# Patient Record
Sex: Male | Born: 2000 | Hispanic: No | Marital: Single | Smoking: Never smoker
Health system: Southern US, Community
[De-identification: ages and names within clinical notes are randomized; demographics above are authoritative.]

---

## 2018-03-01 ENCOUNTER — Ambulatory Visit: Payer: Self-pay | Admitting: Family Medicine

## 2018-03-01 ENCOUNTER — Encounter: Payer: Self-pay | Admitting: Family Medicine

## 2018-03-01 VITALS — BP 110/84 | HR 67 | Temp 99.1°F | Wt 165.8 lb

## 2018-03-01 DIAGNOSIS — J029 Acute pharyngitis, unspecified: Secondary | ICD-10-CM

## 2018-03-01 DIAGNOSIS — B349 Viral infection, unspecified: Secondary | ICD-10-CM

## 2018-03-01 LAB — POCT RAPID STREP A (OFFICE): Rapid Strep A Screen: NEGATIVE

## 2018-03-01 NOTE — Patient Instructions (Signed)
PLAN< Tylenol 650 mg or Motrin 600 mg (with food) for the next 2 days Warm Salt water gargles Change toothbrush Over the counter throat lozenge Increase hydration, balanced diet  Viral Illness, Adult Viruses are tiny germs that can get into a person's body and cause illness. There are many different types of viruses, and they cause many types of illness. Viral illnesses can range from mild to severe. They can affect various parts of the body. Common illnesses that are caused by a virus include colds and the flu. Viral illnesses also include serious conditions such as HIV/AIDS (human immunodeficiency virus/acquired immunodeficiency syndrome). A few viruses have been linked to certain cancers. What are the causes? Many types of viruses can cause illness. Viruses invade cells in your body, multiply, and cause the infected cells to malfunction or die. When the cell dies, it releases more of the virus. When this happens, you develop symptoms of the illness, and the virus continues to spread to other cells. If the virus takes over the function of the cell, it can cause the cell to divide and grow out of control, as is the case when a virus causes cancer. Different viruses get into the body in different ways. You can get a virus by:  Swallowing food or water that is contaminated with the virus.  Breathing in droplets that have been coughed or sneezed into the air by an infected person.  Touching a surface that has been contaminated with the virus and then touching your eyes, nose, or mouth.  Being bitten by an insect or animal that carries the virus.  Having sexual contact with a person who is infected with the virus.  Being exposed to blood or fluids that contain the virus, either through an open cut or during a transfusion.  If a virus enters your body, your body's defense system (immune system) will try to fight the virus. You may be at higher risk for a viral illness if your immune system is  weak. What are the signs or symptoms? Symptoms vary depending on the type of virus and the location of the cells that it invades. Common symptoms of the main types of viral illnesses include: Cold and flu viruses  Fever.  Headache.  Sore throat.  Muscle aches.  Nasal congestion.  Cough. Digestive system (gastrointestinal) viruses  Fever.  Abdominal pain.  Nausea.  Diarrhea. Liver viruses (hepatitis)  Loss of appetite.  Tiredness.  Yellowing of the skin (jaundice). Brain and spinal cord viruses  Fever.  Headache.  Stiff neck.  Nausea and vomiting.  Confusion or sleepiness. Skin viruses  Warts.  Itching.  Rash. Sexually transmitted viruses  Discharge.  Swelling.  Redness.  Rash. How is this treated? Viruses can be difficult to treat because they live within cells. Antibiotic medicines do not treat viruses because these drugs do not get inside cells. Treatment for a viral illness may include:  Resting and drinking plenty of fluids.  Medicines to relieve symptoms. These can include over-the-counter medicine for pain and fever, medicines for cough or congestion, and medicines to relieve diarrhea.  Antiviral medicines. These drugs are available only for certain types of viruses. They may help reduce flu symptoms if taken early. There are also many antiviral medicines for hepatitis and HIV/AIDS.  Some viral illnesses can be prevented with vaccinations. A common example is the flu shot. Follow these instructions at home: Medicines   Take over-the-counter and prescription medicines only as told by your health care provider.  If  you were prescribed an antiviral medicine, take it as told by your health care provider. Do not stop taking the medicine even if you start to feel better.  Be aware of when antibiotics are needed and when they are not needed. Antibiotics do not treat viruses. If your health care provider thinks that you may have a bacterial  infection as well as a viral infection, you may get an antibiotic. ? Do not ask for an antibiotic prescription if you have been diagnosed with a viral illness. That will not make your illness go away faster. ? Frequently taking antibiotics when they are not needed can lead to antibiotic resistance. When this develops, the medicine no longer works against the bacteria that it normally fights. General instructions  Drink enough fluids to keep your urine clear or pale yellow.  Rest as much as possible.  Return to your normal activities as told by your health care provider. Ask your health care provider what activities are safe for you.  Keep all follow-up visits as told by your health care provider. This is important. How is this prevented? Take these actions to reduce your risk of viral infection:  Eat a healthy diet and get enough rest.  Wash your hands often with soap and water. This is especially important when you are in public places. If soap and water are not available, use hand sanitizer.  Avoid close contact with friends and family who have a viral illness.  If you travel to areas where viral gastrointestinal infection is common, avoid drinking water or eating raw food.  Keep your immunizations up to date. Get a flu shot every year as told by your health care provider.  Do not share toothbrushes, nail clippers, razors, or needles with other people.  Always practice safe sex.  Contact a health care provider if:  You have symptoms of a viral illness that do not go away.  Your symptoms come back after going away.  Your symptoms get worse. Get help right away if:  You have trouble breathing.  You have a severe headache or a stiff neck.  You have severe vomiting or abdominal pain. This information is not intended to replace advice given to you by your health care provider. Make sure you discuss any questions you have with your health care provider. Document Released:  09/12/2015 Document Revised: 10/15/2015 Document Reviewed: 09/12/2015 Elsevier Interactive Patient Education  [employer].

## 2018-03-01 NOTE — Progress Notes (Signed)
Scott Huang is a 17 y.o. male who presents today with concerns of sinus congestion and sore throat for the last 3 days. He reports recent travel and exposure to drastically differing temperatures over 2 days.  Review of Systems  Constitutional: Negative for chills, fever and malaise/fatigue.  HENT: Positive for congestion and sore throat. Negative for ear discharge, ear pain and sinus pain.   Eyes: Negative.   Respiratory: Negative for cough, sputum production and shortness of breath.   Cardiovascular: Negative.  Negative for chest pain.  Gastrointestinal: Negative for abdominal pain, diarrhea, nausea and vomiting.  Genitourinary: Negative for dysuria, frequency, hematuria and urgency.  Musculoskeletal: Negative for myalgias.  Skin: Negative.   Neurological: Negative for headaches.  Endo/Heme/Allergies: Negative.   Psychiatric/Behavioral: Negative.     O: Vitals:   03/01/18 1650  BP: 110/84  Pulse: 67  Temp: 99.1 F (37.3 C)  SpO2: 98%     Physical Exam  Constitutional: He is oriented to person, place, and time. Vital signs are normal. He appears well-developed and well-nourished. He is active.  Non-toxic appearance. He does not have a sickly appearance.  HENT:  Head: Normocephalic.  Right Ear: Hearing, tympanic membrane, external ear and ear canal normal.  Left Ear: Hearing, tympanic membrane, external ear and ear canal normal.  Nose: Nose normal.  Mouth/Throat: Uvula is midline. Oropharyngeal exudate and posterior oropharyngeal erythema present. Tonsils are 1+ on the right. Tonsils are 1+ on the left. No tonsillar exudate.  Neck: Normal range of motion. Neck supple.  Cardiovascular: Normal rate, regular rhythm, normal heart sounds and normal pulses.  Pulmonary/Chest: Effort normal and breath sounds normal.  Abdominal: Soft. Bowel sounds are normal.  Musculoskeletal: Normal range of motion.  Lymphadenopathy:       Head (right side): No submental and no submandibular  adenopathy present.       Head (left side): No submental and no submandibular adenopathy present.    He has no cervical adenopathy.  Neurological: He is alert and oriented to person, place, and time.  Psychiatric: He has a normal mood and affect.  Vitals reviewed.    A: 1. Sore throat   2. Viral illness      P: Exam findings, diagnosis etiology and medication use and indications reviewed with patient. Follow- Up and discharge instructions provided. No emergent/urgent issues found on exam.  Patient verbalized understanding of information provided and agrees with plan of care (POC), all questions answered.  1. Sore throat - POCT rapid strep A Results for orders placed or performed in visit on 03/01/18 (from the past 24 hour(s))  POCT rapid strep A     Status: None   Collection Time: 03/01/18  5:00 PM  Result Value Ref Range   Rapid Strep A Screen Negative Negative   2. Viral illness Supportive symptomatic care- exam unremarkable Work note x 48 hours a patient speaks for his job primarily.

## 2019-07-30 ENCOUNTER — Emergency Department (HOSPITAL_COMMUNITY): Payer: BC Managed Care – PPO

## 2019-07-30 ENCOUNTER — Other Ambulatory Visit: Payer: Self-pay

## 2019-07-30 ENCOUNTER — Encounter (HOSPITAL_COMMUNITY): Payer: Self-pay

## 2019-07-30 ENCOUNTER — Emergency Department (HOSPITAL_COMMUNITY)
Admission: EM | Admit: 2019-07-30 | Discharge: 2019-07-30 | Disposition: A | Discharge status: Home / Self Care | Payer: BC Managed Care – PPO | Attending: Emergency Medicine | Admitting: Emergency Medicine

## 2019-07-30 DIAGNOSIS — Y9229 Other specified public building as the place of occurrence of the external cause: Secondary | ICD-10-CM | POA: Diagnosis not present

## 2019-07-30 DIAGNOSIS — R112 Nausea with vomiting, unspecified: Secondary | ICD-10-CM | POA: Diagnosis not present

## 2019-07-30 DIAGNOSIS — Y998 Other external cause status: Secondary | ICD-10-CM | POA: Diagnosis not present

## 2019-07-30 DIAGNOSIS — S060X9A Concussion with loss of consciousness of unspecified duration, initial encounter: Secondary | ICD-10-CM | POA: Diagnosis not present

## 2019-07-30 DIAGNOSIS — W0110XA Fall on same level from slipping, tripping and stumbling with subsequent striking against unspecified object, initial encounter: Secondary | ICD-10-CM | POA: Diagnosis not present

## 2019-07-30 DIAGNOSIS — S0990XA Unspecified injury of head, initial encounter: Secondary | ICD-10-CM | POA: Diagnosis present

## 2019-07-30 DIAGNOSIS — Y9389 Activity, other specified: Secondary | ICD-10-CM | POA: Diagnosis not present

## 2019-07-30 NOTE — ED Triage Notes (Signed)
Patient arrived stating on Friday night he fell and hit head on the right side, noticed swelling after injury. Since he has had constant nausea and one episode of vomiting. Denies any LOC, no blood thinners. Has been taking Ibuprofen with little relief.

## 2019-07-30 NOTE — ED Provider Notes (Signed)
[facility] DEPT Provider Note   CSN: [id number] Arrival date & time: 07/30/19  0058     History Chief Complaint  Patient presents with  . Headache    Scott Huang is a 19 y.o. male.   Head Injury Location:  R parietal Time since incident:  2 days Mechanism of injury: fall   Pain details:    Quality:  Aching   Severity:  Moderate   Timing:  Constant   Progression:  Worsening Chronicity:  New Relieved by:  Nothing Worsened by:  Nothing Associated symptoms: headache, nausea and vomiting   Associated symptoms: no double vision, no focal weakness and no neck pain   Patient without any medical conditions presents after head injury 2 nights ago.  Patient reports he was at a cabin in [place] with friends and was drinking alcohol.  He reports he slipped and hit his head on the right side.  He is unsure if he had LOC but definitively had vomiting soon after.  Since then he has had persistent nausea and headaches that are not responding to ibuprofen.  No focal weakness.      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 Smoking status: Never Smoker  . Smokeless tobacco: Never Used  Substance Use Topics  . Alcohol use: Not on file  . Drug use: Not on file    Home Medications Prior to Admission medications   Medication Sig Start Date End Date Taking? Authorizing Provider  doxycycline (VIBRA-TABS) 100 MG tablet Take 100 mg by mouth daily.    Yes [provider]  ibuprofen (ADVIL) 200 MG tablet Take 400 mg by mouth every 6 (six) hours as needed for headache, mild pain, moderate pain or cramping.   Yes [provider]  loratadine (CLARITIN) 10 MG tablet Take 10 mg by mouth daily as needed for allergies.    Yes [provider]    Allergies    Patient has no known allergies.  Review of Systems   Review of Systems  Constitutional: Negative for fever.  Eyes: Negative for double  vision.  Gastrointestinal: Positive for nausea and vomiting.  Musculoskeletal: Negative for neck pain.  Neurological: Positive for headaches. Negative for focal weakness and weakness.  All other systems reviewed and are negative.   Physical Exam Updated Vital Signs BP (!) 140/91 (BP Location: Right Arm)   Pulse 63   Temp 98.5 F (36.9 C) (Oral)   Resp 14   Ht 1.778 m (5\' 10" )   Wt 77.1 kg   SpO2 100%   BMI 24.39 kg/m   Physical Exam CONSTITUTIONAL: Well developed/well nourished HEAD: tendeness to right side of scalp, no crepitus or stepoffs EYES: EOMI/PERRL ENMT: Mucous membranes moist NECK: supple no meningeal signs SPINE/BACK:entire spine nontender, No bruising/crepitance/stepoffs noted to spine CV: S1/S2 noted, no murmurs/rubs/gallops noted LUNGS: Lungs are clear to auscultation bilaterally, no apparent distress ABDOMEN: soft, nontender NEURO: Pt is awake/alert/appropriate, moves all extremitiesx4.  No facial droop.  GCS 15 EXTREMITIES: pulses normal/equal, full ROM SKIN: warm, color normal PSYCH: no abnormalities of mood noted, alert and oriented to situation  ED Results / Procedures / Treatments   Labs (all labs ordered are listed, but only abnormal results are displayed) Labs Reviewed - No data to display  EKG None  Radiology CT Head Wo Contrast  Result Date: 07/30/2019 CLINICAL DATA:  Fall Friday night, swelling since injury with continued nausea and emesis EXAM: CT  HEAD WITHOUT CONTRAST TECHNIQUE: Contiguous axial images were obtained from the base of the skull through the vertex without intravenous contrast. COMPARISON:  None. FINDINGS: Brain: No evidence of acute infarction, hemorrhage, hydrocephalus, extra-axial collection or mass lesion/mass effect. Vascular: No hyperdense vessel or unexpected calcification. Skull: Small fluid attenuation cystic structure in the right occipital scalp likely reflects a small dermal inclusion cyst/tricholemmal cyst. No  significant scalp swelling, hema0toma, calvarial fracture or other acute or suspicious osseous abnormality. Sinuses/Orbits: Paranasal sinuses and mastoid air cells are predominantly clear. Other: None IMPRESSION: No acute intracranial findings. Electronically Signed   By: Kreg Shropshire M.D.   On: 07/30/2019 02:49    Procedures Procedures    Medications Ordered in ED Medications - No data to display  ED Course  I have reviewed the triage vital signs and the nursing notes.  Pertinent  imaging results that were available during my care of the patient were reviewed by me and considered in my medical decision making (see chart for details).    MDM Rules/Calculators/A&P                      2:51 AM Discussed with mother, she is requesting CT head 3:23 AM CT head negative.  Likely concussion.  Discharged home Final Clinical Impression(s) / ED Diagnoses Final diagnoses:  Concussion with loss of consciousness, initial encounter    Rx / DC Orders ED Discharge Orders    None       Zadie Rhine, MD 07/30/19 [phone]

## 2020-02-06 ENCOUNTER — Other Ambulatory Visit: Payer: BC Managed Care – PPO

## 2020-02-06 DIAGNOSIS — Z20822 Contact with and (suspected) exposure to covid-19: Secondary | ICD-10-CM

## 2020-02-08 LAB — NOVEL CORONAVIRUS, NAA: SARS-CoV-2, NAA: NOT DETECTED

## 2020-02-08 LAB — SARS-COV-2, NAA 2 DAY TAT

## 2020-03-17 ENCOUNTER — Encounter (HOSPITAL_COMMUNITY): Payer: Self-pay

## 2020-03-17 ENCOUNTER — Emergency Department (HOSPITAL_COMMUNITY)
Admission: EM | Admit: 2020-03-17 | Discharge: 2020-03-17 | Disposition: A | Discharge status: Home / Self Care | Payer: BC Managed Care – PPO | Attending: Emergency Medicine | Admitting: Emergency Medicine

## 2020-03-17 ENCOUNTER — Other Ambulatory Visit: Payer: Self-pay

## 2020-03-17 DIAGNOSIS — R070 Pain in throat: Secondary | ICD-10-CM | POA: Diagnosis present

## 2020-03-17 DIAGNOSIS — J020 Streptococcal pharyngitis: Secondary | ICD-10-CM | POA: Insufficient documentation

## 2020-03-17 DIAGNOSIS — R110 Nausea: Secondary | ICD-10-CM | POA: Insufficient documentation

## 2020-03-17 LAB — GROUP A STREP BY PCR: Group A Strep by PCR: DETECTED — AB

## 2020-03-17 MED ORDER — PENICILLIN G BENZATHINE 1200000 UNIT/2ML IM SUSP
1.2000 10*6.[IU] | Freq: Once | INTRAMUSCULAR | Status: AC
  Administered 2020-03-17: 1.2 10*6.[IU] via INTRAMUSCULAR
  Filled 2020-03-17: qty 2

## 2020-03-17 NOTE — ED Triage Notes (Signed)
Patient arrived stating that yesterday he began having a sore throat, nausea, body aches.

## 2020-03-17 NOTE — ED Provider Notes (Signed)
[facility]-EMERGENCY DEPT Provider Note   CSN: [id number] Arrival date & time: 03/17/20  2100     History Chief Complaint  Patient presents with  . Sore Throat    Scott Huang is a 19 y.o. male without significant past medical history who presents to the emergency department with complaints of sore throat that began yesterday.  Patient states her throat is constant, no significant alleviating aggravating factors, and associated with chills, body ache, and nausea.  Had evaluation by outpatient provider who prescribed methylprednisone and Augmentin.  Denies fever, ear pain, cough, dyspnea, abdominal pain, or syncope.  HPI     History reviewed. No pertinent past medical history.  There are no problems to display for this patient.   History reviewed. No pertinent surgical history.     History reviewed. No pertinent family history.  Social History   Tobacco Use  . Smoking status: Never Smoker  . Smokeless tobacco: Never Used  Substance Use Topics  . Alcohol use: Not on file  . Drug use: Not on file    Home Medications Prior to Admission medications   Medication Sig Start Date End Date Taking? Authorizing Provider  amoxicillin-clavulanate (AUGMENTIN) 875-125 MG tablet Take 1 tablet by mouth 2 (two) times daily. 03/17/20  Yes [provider]  ibuprofen (ADVIL) 200 MG tablet Take 400 mg by mouth every 6 (six) hours as needed for headache, mild pain, moderate pain or cramping.   Yes [provider]  methylPREDNISolone (MEDROL DOSEPAK) 4 MG TBPK tablet Take 4 mg by mouth See admin instructions. 6 day taper 03/17/20  Yes [provider]    Allergies    Patient has no known allergies.  Review of Systems   Review of Systems  Constitutional: Positive for chills. Negative for fever.  HENT: Positive for sore throat. Negative for ear pain.   Respiratory: Negative for cough and shortness of breath.   Cardiovascular: Negative  for chest pain.  Gastrointestinal: Positive for nausea. Negative for abdominal pain.  Genitourinary: Negative for dysuria.  Musculoskeletal: Positive for myalgias.  Neurological: Negative for syncope.    Physical Exam Updated Vital Signs BP (!) 151/86 (BP Location: Right Arm)   Pulse 96   Temp 98.4 F (36.9 C) (Oral)   Resp 20   Ht 5\' 10"  (1.778 m)   Wt 74.8 kg   SpO2 96%   BMI 23.68 kg/m   Physical Exam Vitals and nursing note reviewed.  Constitutional:      General: He is not in acute distress.    Appearance: He is well-developed. He is not toxic-appearing.  HENT:     Head: Normocephalic and atraumatic.     Right Ear: Ear canal normal. Tympanic membrane is not perforated, erythematous, retracted or bulging.     Left Ear: Ear canal normal. Tympanic membrane is not perforated, erythematous, retracted or bulging.     Ears:     Comments: No mastoid erythema/swellng/tenderness.     Nose:     Right Sinus: No maxillary sinus tenderness or frontal sinus tenderness.     Left Sinus: No maxillary sinus tenderness or frontal sinus tenderness.     Mouth/Throat:     Pharynx: Oropharynx is clear. Uvula midline. Posterior oropharyngeal erythema present. No oropharyngeal exudate.     Comments: Posterior oropharynx is symmetric appearing. Patient tolerating own secretions without difficulty. No trismus. No drooling. No hot potato voice. No swelling beneath the tongue, submandibular compartment is soft.  Eyes:     General:  Right eye: No discharge.        Left eye: No discharge.     Conjunctiva/sclera: Conjunctivae normal.  Cardiovascular:     Rate and Rhythm: Normal rate and regular rhythm.  Pulmonary:     Effort: Pulmonary effort is normal. No respiratory distress.     Breath sounds: Normal breath sounds. No wheezing, rhonchi or rales.  Abdominal:     General: There is no distension.     Palpations: Abdomen is soft.     Tenderness: There is no abdominal tenderness.   Musculoskeletal:     Cervical back: Neck supple. No rigidity.  Lymphadenopathy:     Cervical: No cervical adenopathy.  Skin:    General: Skin is warm and dry.     Findings: No rash.  Neurological:     Mental Status: He is alert.  Psychiatric:        Behavior: Behavior normal.     ED Results / Procedures / Treatments   Labs (all labs ordered are listed, but only abnormal results are displayed) Labs Reviewed  GROUP A STREP BY PCR - Abnormal; Notable for the following components:      Result Value   Group A Strep by PCR DETECTED (*)    All other components within normal limits    EKG None  Radiology No results found.  Procedures Procedures (including critical care time)  Medications Ordered in ED Medications  penicillin g benzathine (BICILLIN LA) 1200000 UNIT/2ML injection 1.2 Million Units (has no administration in time range)    ED Course  I have reviewed the triage vital signs and the nursing notes.  Pertinent labs & imaging results that were available during my care of the patient were reviewed by me and considered in my medical decision making (see chart for details).    MDM Rules/Calculators/A&P                          Patient presents to the emergency department with complaints of sore throat.  He is nontoxic, resting comfortably, his vitals are within normal limits with the exception of his elevated blood pressure, doubt hypertensive emergency.  Strep test is positive, suspect this is the underlying etiology of patient's symptoms-he was given Augmentin twice daily for 14 days as well as a steroid from an alternative provider, we discussed options of treatment, ultimately he would like to receive IM Bicillin in the emergency department for treatment of this, will not take the Augmentin he was previously prescribed.  His exam is not consistent with RPA/PTA.  He has no meningismus.  His lungs are clear to auscultation without signs of respiratory distress.  His  abdomen is nontender without peritoneal signs.  He appears appropriate for discharge home at this time. I discussed results, treatment plan, need for follow-up, and return precautions with the patient. Provided opportunity for questions, patient confirmed understanding and is in agreement with plan.   Final Clinical Impression(s) / ED Diagnoses Final diagnoses:  Strep pharyngitis    Rx / DC Orders ED Discharge Orders    None       Cherly Anderson, PA-C 03/17/20 2319    Charlynne Pander, MD 03/17/20 [phone]

## 2020-03-17 NOTE — ED Notes (Signed)
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 Requesting to not get swabbed at this time. Reports concerned for Mono

## 2020-03-17 NOTE — Discharge Instructions (Addendum)
You were seen in the emergency department today for a sore throat.  Your strep test was positive.  We treated this with a shot of antibiotics in the emergency department (Bicillin)-given we have given you the shot here in the emergency department you do not need to take the Augmentin that was previously prescribed to you.  You may take the methylprednisolone that was prescribed to you-this is a steroid to help with pain and inflammation.  You may also take Tylenol per over-the-counter dosing as needed for discomfort as well.  Please follow-up with a primary care provider within 5 days-you may call the number circled in your discharge instructions for assistance finding a primary care provider in the area.  Return to the ER for new or worsening symptoms or any other concerns.  Your blood pressure was also elevated in the ER today, have this rechecked your primary care appointment.  Vitals:   03/17/20 2107  BP: (!) 151/86  Pulse: 96  Resp: 20  Temp: 98.4 F (36.9 C)  SpO2: 96%

## 2020-05-07 ENCOUNTER — Ambulatory Visit
Admission: EM | Admit: 2020-05-07 | Discharge: 2020-05-07 | Disposition: A | Discharge status: Home / Self Care | Payer: BC Managed Care – PPO

## 2020-05-07 ENCOUNTER — Other Ambulatory Visit: Payer: Self-pay

## 2020-05-07 DIAGNOSIS — K602 Anal fissure, unspecified: Secondary | ICD-10-CM

## 2020-05-07 NOTE — ED Triage Notes (Signed)
Pt c/o a pain/sensation to rectum a week ago. States took a pic and saw a tear. States week prior to this had constipation and was straining. States treating with wipes and hemorrhoids creams. Now has a rash to rectum area and having itching. States has pain when having a BM.

## 2020-05-07 NOTE — Discharge Instructions (Signed)
Sitz bath as needed for additional pain relief: For few inches of warm, plain water and sit. Avoid Epson salt as this can burn. Use rectal suppositories as directed. Very important follow-up with surgery for further evaluation and management. Go to ER for severe pain, bleeding, fever. 

## 2020-05-07 NOTE — ED Provider Notes (Signed)
[facility]    CSN: [id number] Arrival date & time: 05/07/20  1913      History   Chief Complaint Chief Complaint  Patient presents with  . Rectal Pain    HPI Scott Huang is a 19 y.o. male  Presenting for anal pain x1 week.  Denies trauma to affected area.  Was able to "look and saw a little paper cut looking thing".  Did have constipation prior to onset, though is now having 1 bowel movement daily.  Has been using wipes and hemorrhoid creams without relief.  History reviewed. No pertinent past medical history.  There are no problems to display for this patient.   History reviewed. No pertinent surgical history.     Home Medications    Prior to Admission medications   Medication Sig Start Date End Date Taking? Authorizing Provider  doxycycline (ADOXA) 50 MG tablet Take 50 mg by mouth daily.    [provider]  loratadine (CLARITIN) 10 MG tablet Take 10 mg by mouth daily.    [provider]  omeprazole (PRILOSEC) 20 MG capsule Take 20 mg by mouth daily.    [provider]    Family History History reviewed. No pertinent family history.  Social History Social History   Tobacco Use  . Smoking status: Never Smoker  . Smokeless tobacco: Never Used  Substance Use Topics  . Alcohol use: Yes  . Drug use: Not Currently     Allergies   Patient has no known allergies.   Review of Systems Review of Systems  Constitutional: Negative for fatigue and fever.  Respiratory: Negative for cough and shortness of breath.   Cardiovascular: Negative for chest pain and palpitations.  Gastrointestinal: Positive for rectal pain. Negative for abdominal pain, diarrhea, nausea and vomiting.  Musculoskeletal: Negative for arthralgias and myalgias.  Skin: Negative for rash and wound.  Neurological: Negative for speech difficulty and headaches.  All other systems reviewed and are negative.    Physical Exam Triage Vital Signs ED  Triage Vitals  Enc Vitals Group     BP 05/07/20 1926 137/73     Pulse Rate 05/07/20 1926 89     Resp 05/07/20 1926 20     Temp 05/07/20 1926 98.3 F (36.8 C)     Temp Source 05/07/20 1926 Oral     SpO2 05/07/20 1926 98 %     Weight --      Height --      Head Circumference --      Peak Flow --      Pain Score 05/07/20 1931 0     Pain Loc --      Pain Edu? --      Excl. in GC? --    No data found.  Updated Vital Signs BP 137/73 (BP Location: Left Arm)   Pulse 89   Temp 98.3 F (36.8 C) (Oral)   Resp 20   SpO2 98%   Visual Acuity Right Eye Distance:   Left Eye Distance:   Bilateral Distance:    Right Eye Near:   Left Eye Near:    Bilateral Near:     Physical Exam Constitutional:      General: He is not in acute distress. HENT:     Head: Normocephalic and atraumatic.  Eyes:     General: No scleral icterus.    Pupils: Pupils are equal, round, and reactive to light.  Cardiovascular:     Rate and Rhythm: Normal rate.  Pulmonary:     Effort: Pulmonary effort is normal. No respiratory distress.     Breath sounds: No wheezing.  Genitourinary:    Comments: Small fissure, left lateral Skin:    Coloration: Skin is not jaundiced or pale.  Neurological:     Mental Status: He is alert and oriented to person, place, and time.      UC Treatments / Results  Labs (all labs ordered are listed, but only abnormal results are displayed) Labs Reviewed - No data to display  EKG   Radiology No results found.  Procedures Procedures (including critical care time)  Medications Ordered in UC Medications - No data to display  Initial Impression / Assessment and Plan / UC Course  I have reviewed the triage vital signs and the nursing notes.  Pertinent labs & imaging results that were available during my care of the patient were reviewed by me and considered in my medical decision making (see chart for details).     We will treat supportively as below, provided GI  follow-up information as needed.  Return precautions discussed, pt verbalized understanding and is agreeable to plan. Final Clinical Impressions(s) / UC Diagnoses   Final diagnoses:  Anal fissure     Discharge Instructions     Sitz bath as needed for additional pain relief: For few inches of warm, plain water and sit. Avoid Epson salt as this can burn. Use rectal suppositories as directed. Very important follow-up with surgery for further evaluation and management. Go to ER for severe pain, bleeding, fever.    ED Prescriptions    None     PDMP not reviewed this encounter.   Odette Fraction [place], [place] 05/08/20 [phone]

## 2020-08-24 ENCOUNTER — Ambulatory Visit (INDEPENDENT_AMBULATORY_CARE_PROVIDER_SITE_OTHER): Payer: BC Managed Care – PPO

## 2020-08-24 ENCOUNTER — Ambulatory Visit (HOSPITAL_COMMUNITY)
Admission: EM | Admit: 2020-08-24 | Discharge: 2020-08-24 | Disposition: A | Discharge status: Home / Self Care | Payer: BC Managed Care – PPO | Attending: Physician Assistant | Admitting: Physician Assistant

## 2020-08-24 ENCOUNTER — Other Ambulatory Visit: Payer: Self-pay

## 2020-08-24 ENCOUNTER — Encounter (HOSPITAL_COMMUNITY): Payer: Self-pay | Admitting: Emergency Medicine

## 2020-08-24 DIAGNOSIS — X58XXXA Exposure to other specified factors, initial encounter: Secondary | ICD-10-CM | POA: Diagnosis not present

## 2020-08-24 DIAGNOSIS — S99911A Unspecified injury of right ankle, initial encounter: Secondary | ICD-10-CM | POA: Diagnosis not present

## 2020-08-24 DIAGNOSIS — M25571 Pain in right ankle and joints of right foot: Secondary | ICD-10-CM | POA: Diagnosis not present

## 2020-08-24 DIAGNOSIS — M25471 Effusion, right ankle: Secondary | ICD-10-CM

## 2020-08-24 MED ORDER — NAPROXEN 500 MG PO TABS: 500.0000 mg | ORAL_TABLET | Freq: Two times a day (BID) | ORAL | 0 refills | Status: AC

## 2020-08-24 NOTE — ED Triage Notes (Signed)
Pt presents with right ankle swelling and pain. States rolled ankle last night.

## 2020-08-24 NOTE — ED Provider Notes (Signed)
[facility]    CSN: [id number] Arrival date & time: 08/24/20  1624      History   Chief Complaint Chief Complaint  Patient presents with  . Ankle Pain    Right    HPI Scott Huang is a 20 y.o. male.   Patient presents today with a 1 day history of right ankle pain after injury.  Reports that he was camping and tripped and twisted his ankle.  He was able to ambulate immediately following injury but pain has gradually been worsening.  At rest pain is rated 5 on a 0-10 pain scale but increases to 8 with ambulation/bearing weight, localized to lateral right ankle without radiation, described as aching periodic sharp pains, no aggravating or alleviating factors identified.  He denies previous injury or surgery to the ankle.  He denies any numbness or tingling in toes.  He has not tried any over-the-counter medications for symptom management.  He is requesting excuse note today as his job requires him to walk for 2 hours or more reasonably to be capable of this given injury.  No additional complaints or concerns.     History reviewed. No pertinent past medical history.  There are no problems to display for this patient.   History reviewed. No pertinent surgical history.     Home Medications    Prior to Admission medications   Medication Sig Start Date End Date Taking? Authorizing Provider  naproxen (NAPROSYN) 500 MG tablet Take 1 tablet (500 mg total) by mouth 2 (two) times daily. 08/24/20  Yes Edda Orea K, PA-C  doxycycline (ADOXA) 50 MG tablet Take 50 mg by mouth daily.    [provider]  loratadine (CLARITIN) 10 MG tablet Take 10 mg by mouth daily.    [provider]  omeprazole (PRILOSEC) 20 MG capsule Take 20 mg by mouth daily.    [provider]    Family History History reviewed. No pertinent family history.  Social History Social History   Tobacco Use  . Smoking status: Never Smoker  . Smokeless tobacco: Never Used   Substance Use Topics  . Alcohol use: Yes  . Drug use: Not Currently     Allergies   Patient has no known allergies.   Review of Systems Review of Systems  Constitutional: Positive for activity change. Negative for appetite change, fatigue and fever.  Respiratory: Negative for cough and shortness of breath.   Cardiovascular: Negative for chest pain.  Gastrointestinal: Negative for abdominal pain, diarrhea, nausea and vomiting.  Musculoskeletal: Positive for arthralgias, gait problem and joint swelling. Negative for myalgias.  Neurological: Negative for dizziness, light-headedness, numbness and headaches.     Physical Exam Triage Vital Signs ED Triage Vitals  Enc Vitals Group     BP 08/24/20 1704 124/76     Pulse Rate 08/24/20 1704 68     Resp 08/24/20 1704 16     Temp 08/24/20 1704 97.9 F (36.6 C)     Temp Source 08/24/20 1704 Oral     SpO2 08/24/20 1704 100 %     Weight --      Height --      Head Circumference --      Peak Flow --      Pain Score 08/24/20 1701 5     Pain Loc --      Pain Edu? --      Excl. in GC? --    No data found.  Updated Vital Signs BP 124/76 (  BP Location: Right Arm)   Pulse 68   Temp 97.9 F (36.6 C) (Oral)   Resp 16   SpO2 100%   Visual Acuity Right Eye Distance:   Left Eye Distance:   Bilateral Distance:    Right Eye Near:   Left Eye Near:    Bilateral Near:     Physical Exam Vitals reviewed.  Constitutional:      General: He is awake.     Appearance: Normal appearance. He is normal weight. He is not ill-appearing.     Comments: Very pleasant male appears stated age in no acute distress  HENT:     Head: Normocephalic and atraumatic.  Cardiovascular:     Rate and Rhythm: Normal rate and regular rhythm.     Heart sounds: No murmur heard.     Comments: Capillary refill within 2 seconds bilateral toes Pulmonary:     Effort: Pulmonary effort is normal.     Breath sounds: Normal breath sounds. No stridor. No wheezing,  rhonchi or rales.     Comments: Clear to auscultation bilaterally Musculoskeletal:     Right ankle: Swelling present. No ecchymosis. Tenderness present over the lateral malleolus. Decreased range of motion. Normal pulse.     Comments: Right ankle: Swelling noted lateral right ankle.  No deformity noted.  Decreased range of motion with dorsiflexion and plantar flexion secondary to pain.  Foot neurovascularly intact.  Pain at lateral malleolus.  Neurological:     Mental Status: He is alert.  Psychiatric:        Behavior: Behavior is cooperative.      UC Treatments / Results  Labs (all labs ordered are listed, but only abnormal results are displayed) Labs Reviewed - No data to display  EKG   Radiology DG Ankle Complete Right  Result Date: 08/24/2020 CLINICAL DATA:  Swelling after injury. EXAM: RIGHT ANKLE - COMPLETE 3+ VIEW COMPARISON:  None. FINDINGS: No evidence for an acute fracture. No subluxation or dislocation. Tiny well corticated fragment adjacent to the lateral malleolus likely a prior avulsion injury. No worrisome lytic or sclerotic osseous abnormality. IMPRESSION: Negative. Electronically Signed   By: Kennith Center M.D.   On: 08/24/2020 18:00    Procedures Procedures (including critical care time)  Medications Ordered in UC Medications - No data to display  Initial Impression / Assessment and Plan / UC Course  I have reviewed the triage vital signs and the nursing notes.  Pertinent labs & imaging results that were available during my care of the patient were reviewed by me and considered in my medical decision making (see chart for details).     X-ray was normal.  Discussed that symptoms are likely related to a sprain encourage patient to use ice, compression, elevation for symptom relief.  He was prescribed Naprosyn to be used as needed for pain with instruction not to take additional NSAIDs including aspirin, ibuprofen/Advil, naproxen/Aleve due to risk of GI  bleeding.  He was provided work excuse note as requested.  Recommended that he follow-up with sports medicine if symptoms persist.  Strict return precautions given to which patient expressed understanding.  Final Clinical Impressions(s) / UC Diagnoses   Final diagnoses:  Acute right ankle pain     Discharge Instructions     There is no fracture on exam.  Keep your leg elevated, use ice, compression for symptom relief.  I have prescribed Naprosyn which is an NSAID to help with pain.  You should not take aspirin, ibuprofen/Advil, naproxen/Aleve  with this medication due to risk of GI bleeding.  Follow-up with sports medicine if symptoms persist.    ED Prescriptions    Medication Sig Dispense Auth. Provider   naproxen (NAPROSYN) 500 MG tablet Take 1 tablet (500 mg total) by mouth 2 (two) times daily. 20 tablet Fallen Crisostomo, Noberto Retort, PA-C     PDMP not reviewed this encounter.   Jeani Hawking, PA-C 08/24/20 1823

## 2020-08-24 NOTE — Discharge Instructions (Signed)
There is no fracture on exam.  Keep your leg elevated, use ice, compression for symptom relief.  I have prescribed Naprosyn which is an NSAID to help with pain.  You should not take aspirin, ibuprofen/Advil, naproxen/Aleve with this medication due to risk of GI bleeding.  Follow-up with sports medicine if symptoms persist.

## 2022-11-09 IMAGING — DX DG ANKLE COMPLETE 3+V*R*
3 series · 3 of 3 positions shown · non-contrast
Comparison: None.

CLINICAL DATA: Swelling after injury.

EXAM:
RIGHT ANKLE - COMPLETE 3+ VIEW

[ankle ap]
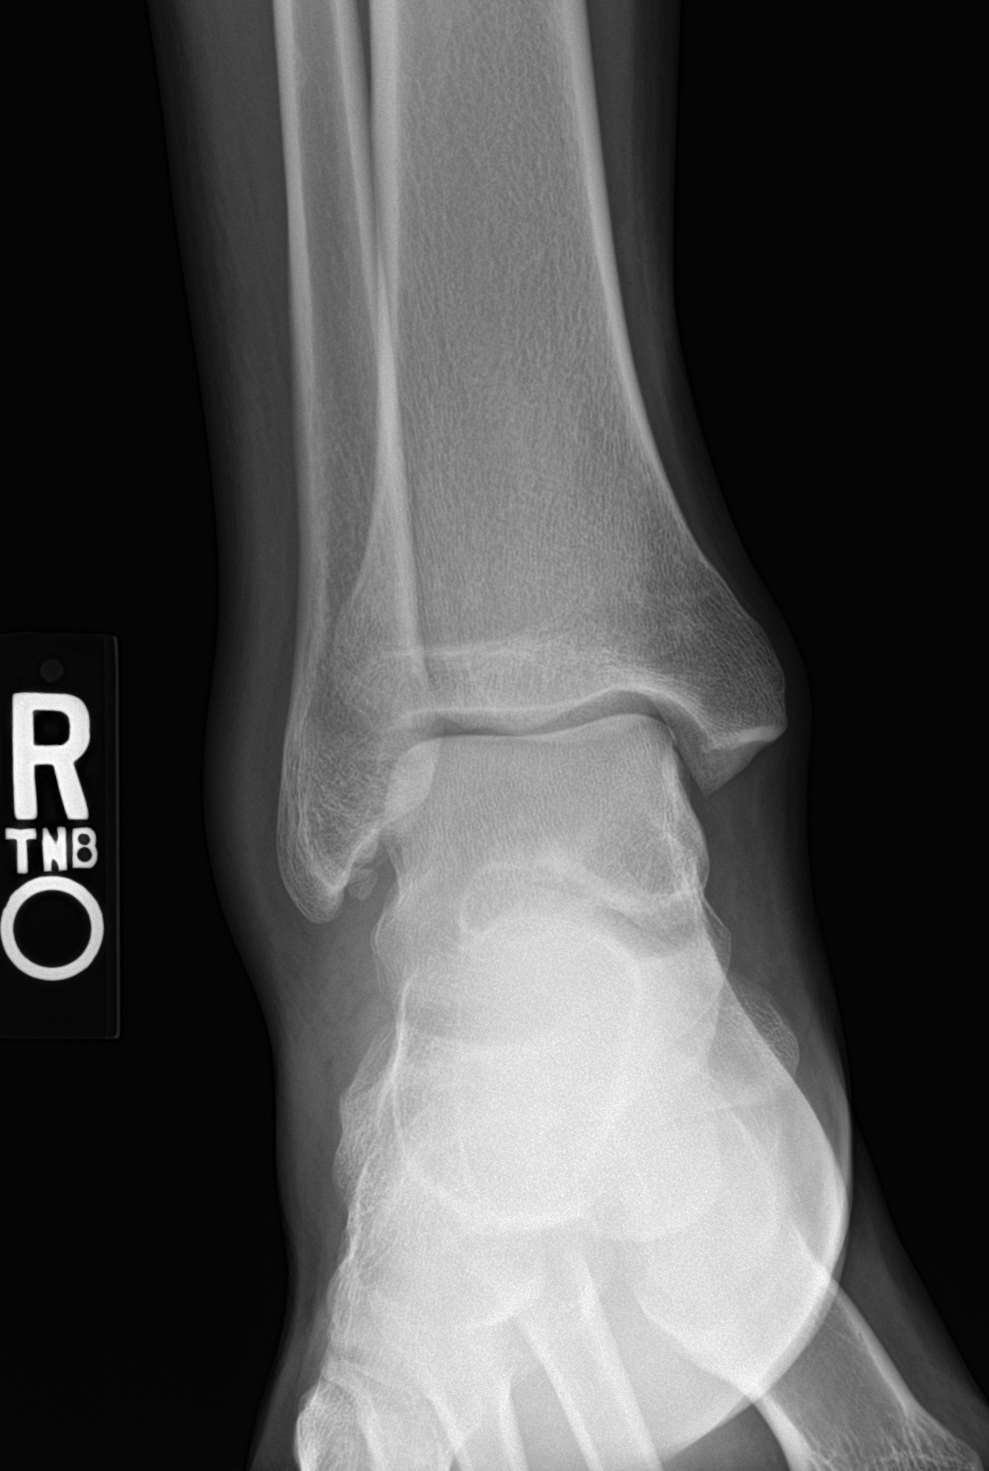

[ankle obl]
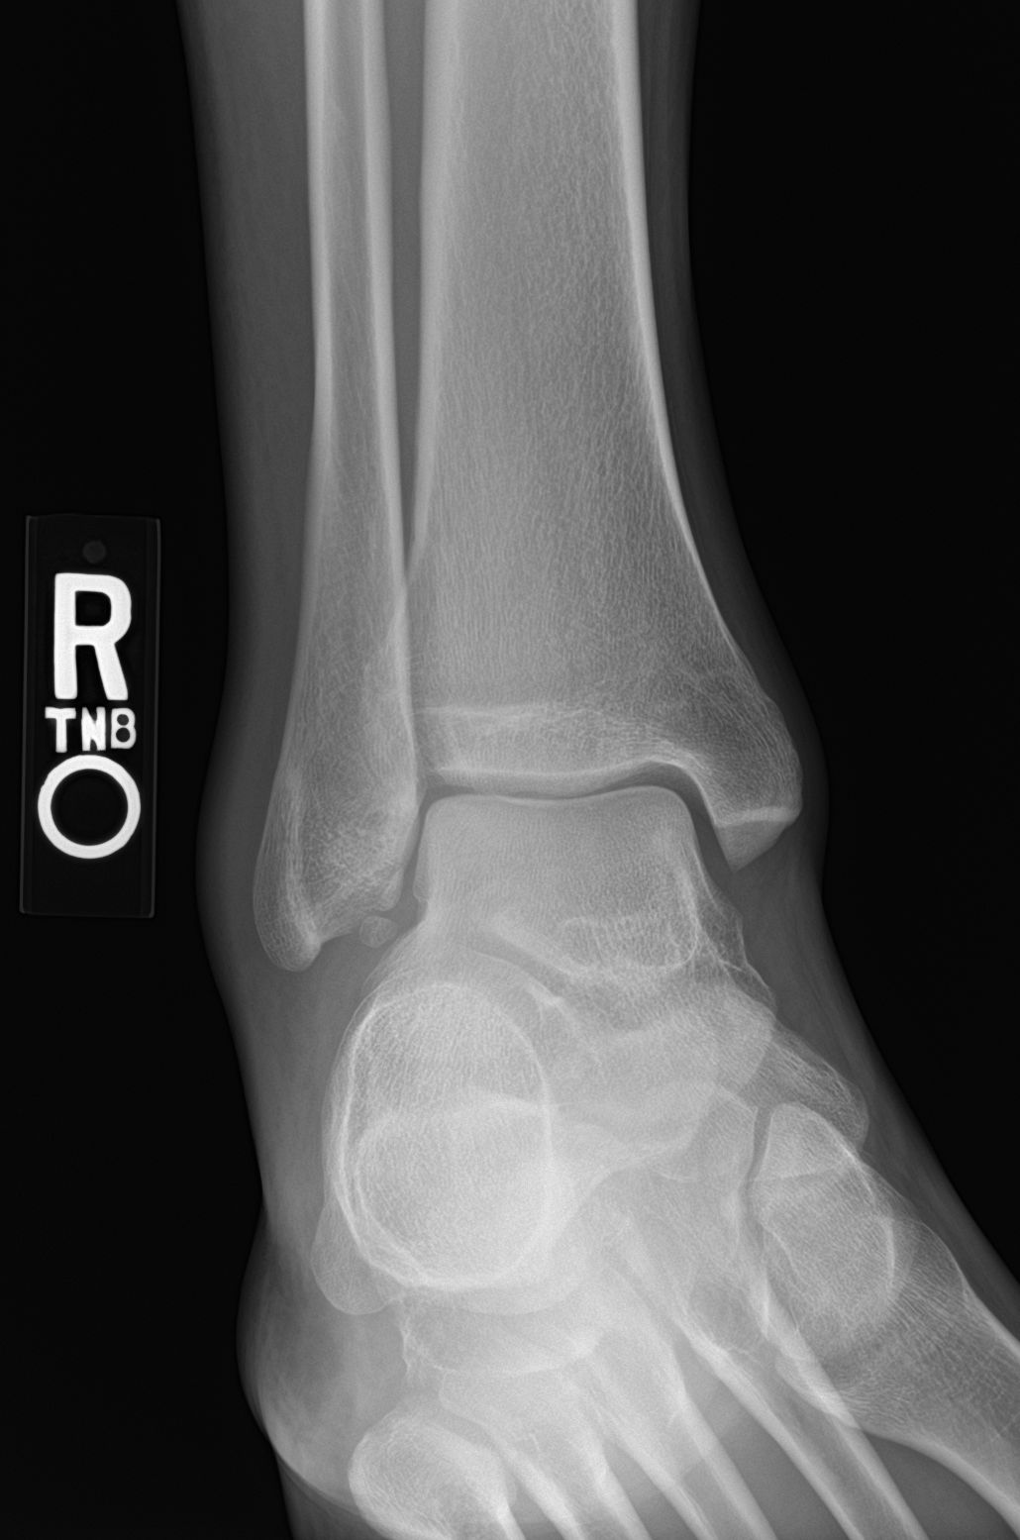

[ankle lat]
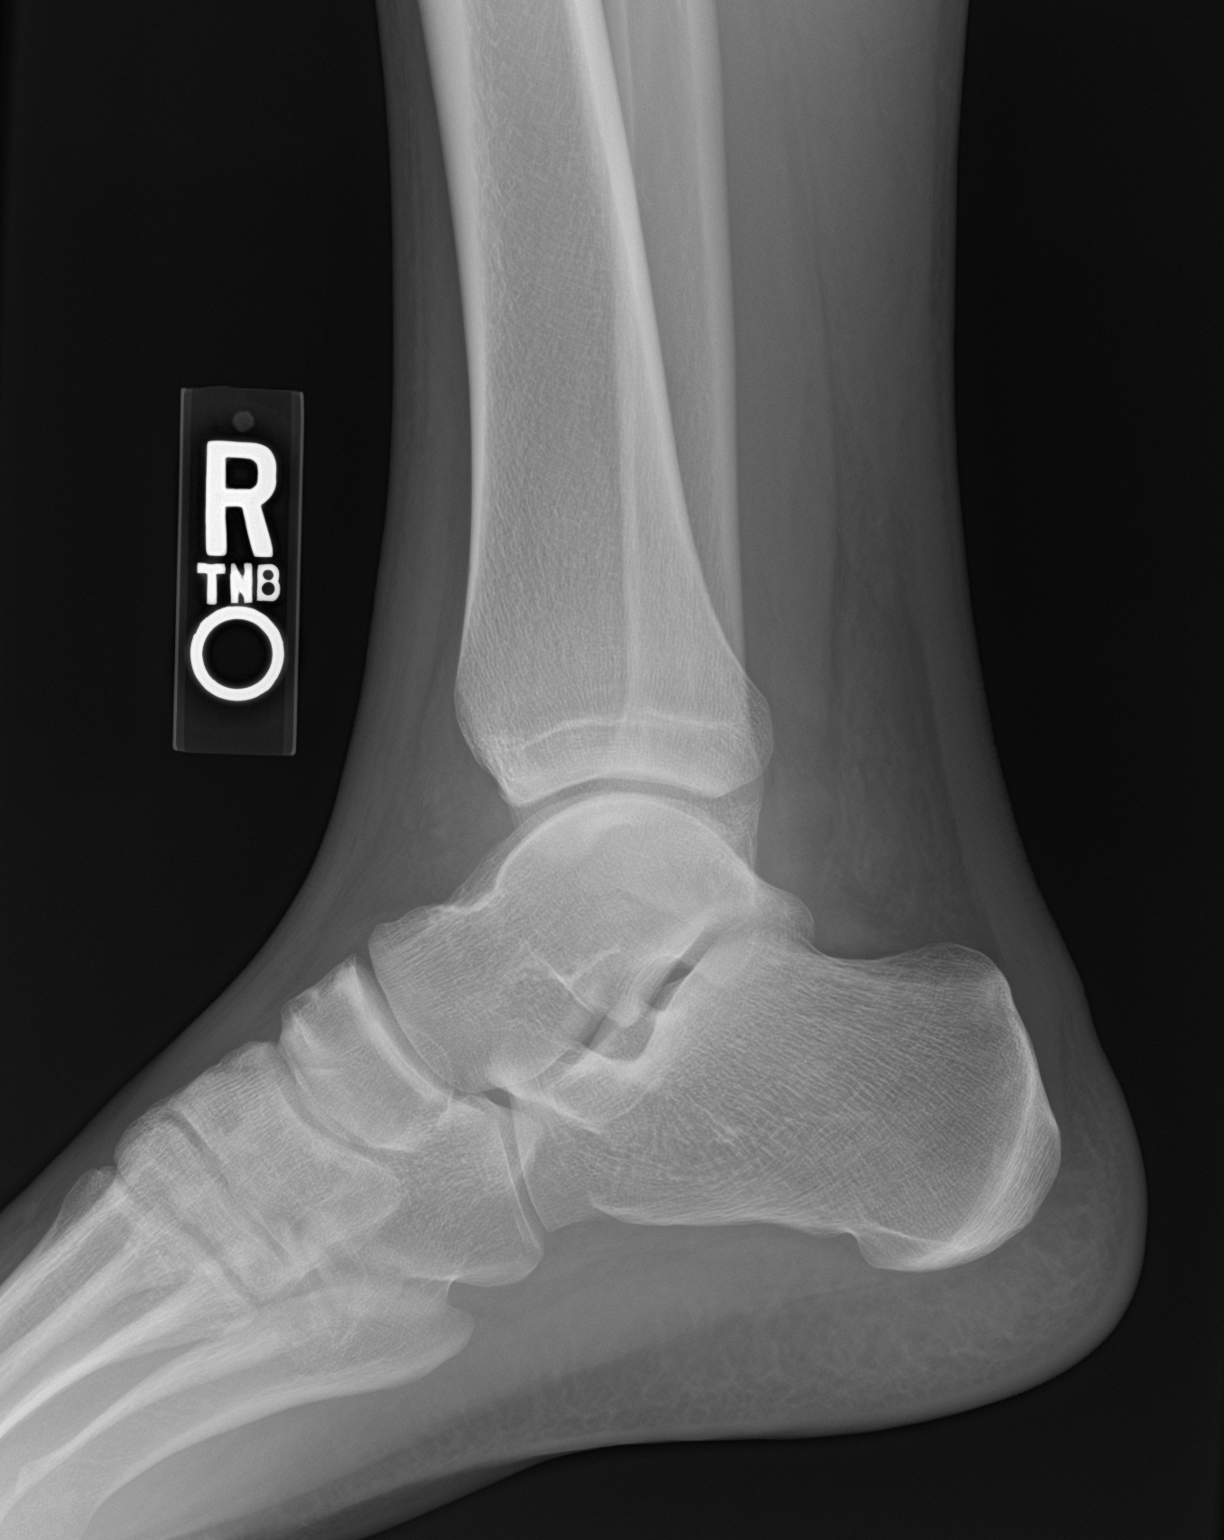

[3 of 3 positions shown; findings below may reference images not displayed]

FINDINGS: No evidence for an acute fracture. No subluxation or dislocation.
Tiny well corticated fragment adjacent to the lateral malleolus
likely a prior avulsion injury. No worrisome lytic or sclerotic
osseous abnormality.
IMPRESSION: Negative.
